# Patient Record
Sex: Male | Born: 1985 | Race: White | Hispanic: No | Smoking: Current every day smoker
Health system: Southern US, Community
[De-identification: ages and names within clinical notes are randomized; demographics above are authoritative.]

## PROBLEM LIST (undated history)

## (undated) HISTORY — PX: CHOLECYSTECTOMY: SHX55

---

## 2006-11-14 ENCOUNTER — Ambulatory Visit (HOSPITAL_COMMUNITY): Admission: RE | Admit: 2006-11-14 | Discharge: 2006-11-14 | Payer: Self-pay | Admitting: Specialist

## 2008-03-06 IMAGING — CR DG ORBITS FOR FOREIGN BODY
2 series · 2 of 2 positions shown · non-contrast
Comparison: None.

CLINICAL DATA: Pre-MRI/metal worker.
 ORBITS FOR FOREIGN BODY - 2 VIEW:

[view not recorded (1 of 2)]
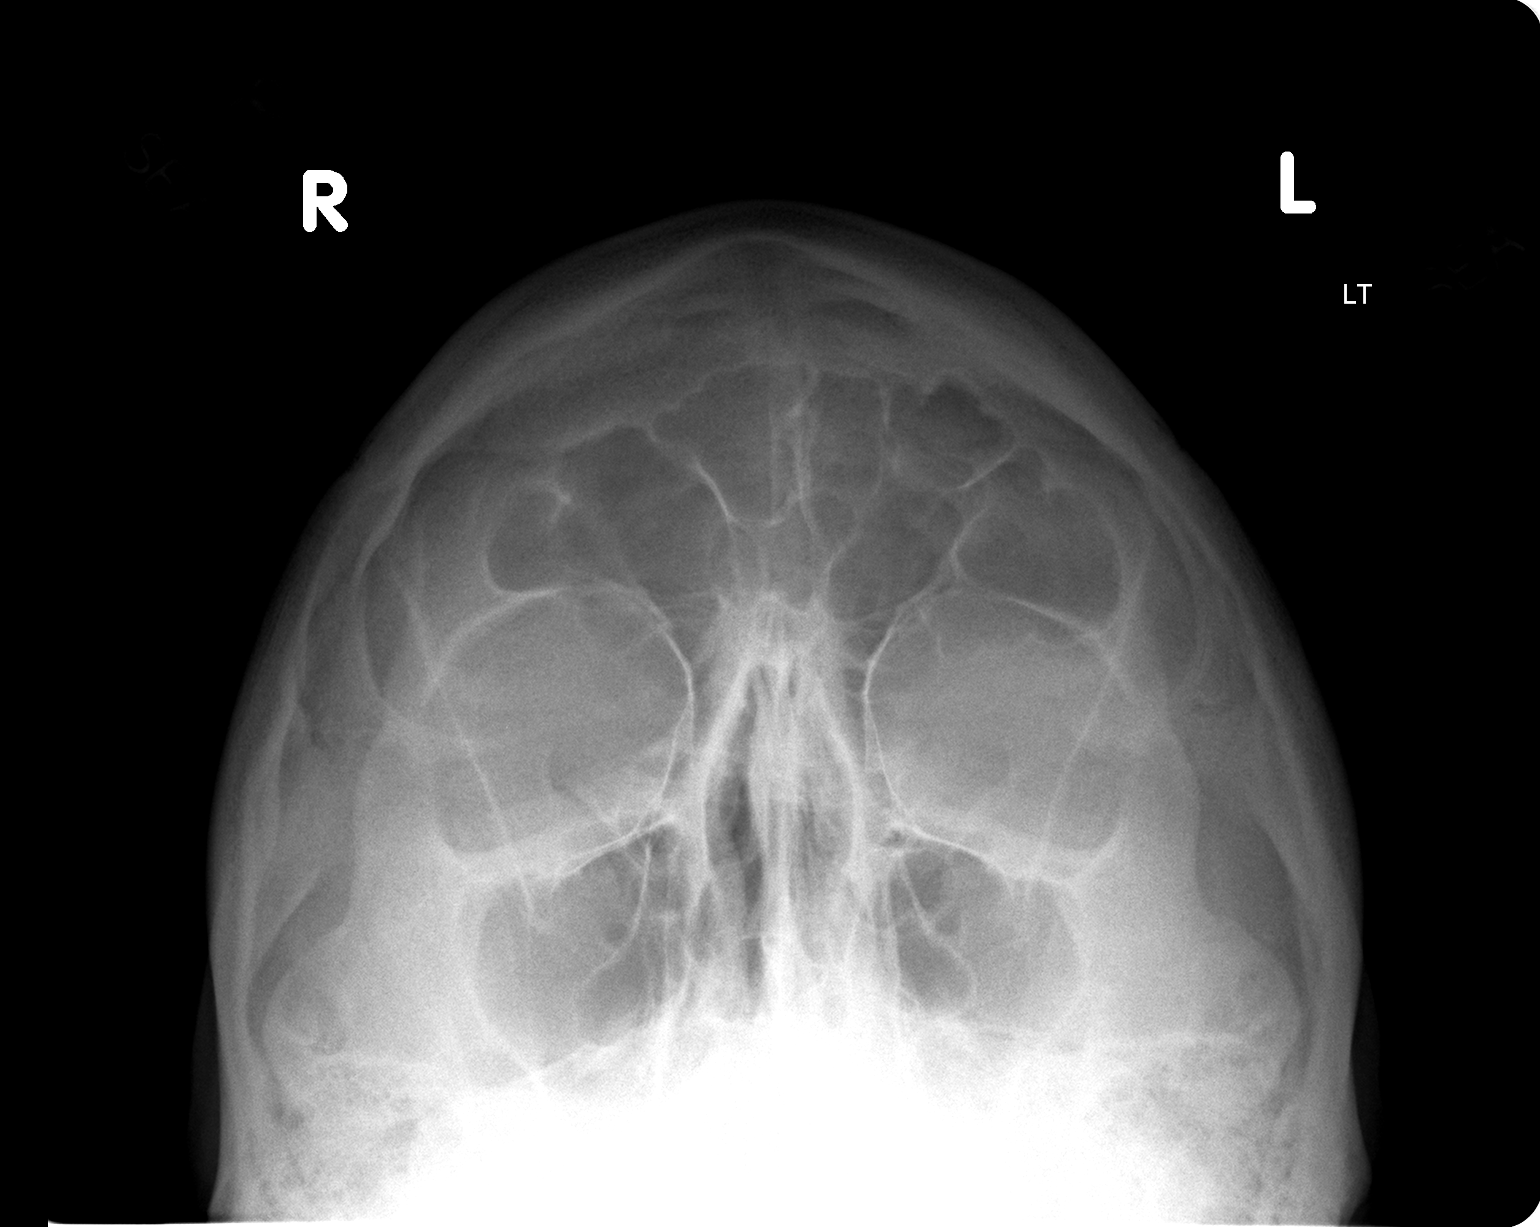

[view not recorded (2 of 2)]
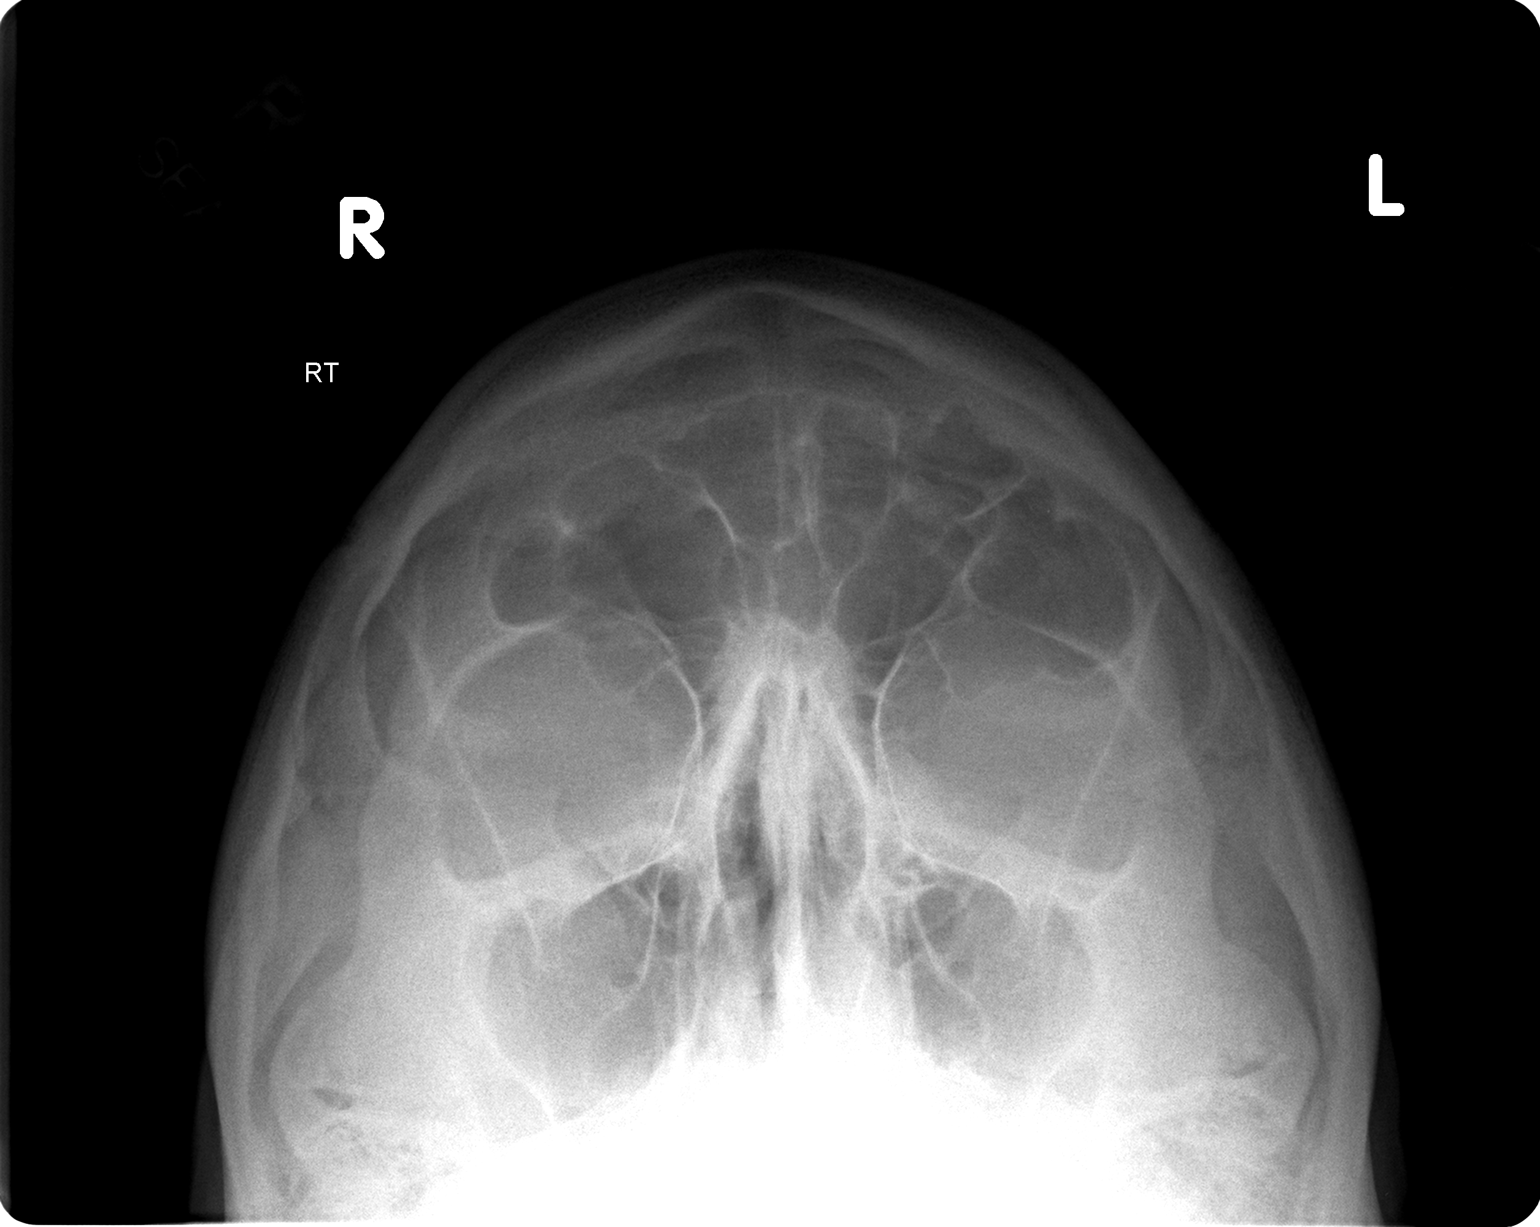

[2 of 2 positions shown; findings below may reference images not displayed]

FINDINGS: There is no evidence of metallic foreign body within the orbits.  No significant bone abnormality identified.
IMPRESSION: No evidence of metallic foreign body within the orbits.

## 2011-04-03 ENCOUNTER — Emergency Department (HOSPITAL_BASED_OUTPATIENT_CLINIC_OR_DEPARTMENT_OTHER)
Admission: EM | Admit: 2011-04-03 | Discharge: 2011-04-03 | Disposition: A | Discharge status: Home / Self Care | Payer: No Typology Code available for payment source | Attending: Emergency Medicine | Admitting: Emergency Medicine

## 2011-04-03 ENCOUNTER — Encounter (HOSPITAL_BASED_OUTPATIENT_CLINIC_OR_DEPARTMENT_OTHER): Payer: Self-pay

## 2011-04-03 DIAGNOSIS — M549 Dorsalgia, unspecified: Secondary | ICD-10-CM

## 2011-04-03 DIAGNOSIS — F17200 Nicotine dependence, unspecified, uncomplicated: Secondary | ICD-10-CM | POA: Insufficient documentation

## 2011-04-03 DIAGNOSIS — M546 Pain in thoracic spine: Secondary | ICD-10-CM | POA: Insufficient documentation

## 2011-04-03 DIAGNOSIS — M545 Low back pain, unspecified: Secondary | ICD-10-CM | POA: Insufficient documentation

## 2011-04-03 DIAGNOSIS — Y9289 Other specified places as the place of occurrence of the external cause: Secondary | ICD-10-CM | POA: Insufficient documentation

## 2011-04-03 MED ORDER — IBUPROFEN 800 MG PO TABS
800.0000 mg | ORAL_TABLET | Freq: Once | ORAL | Status: AC
  Administered 2011-04-03: 800 mg via ORAL
  Filled 2011-04-03: qty 1

## 2011-04-03 MED ORDER — IBUPROFEN 800 MG PO TABS: 800.0000 mg | ORAL_TABLET | Freq: Three times a day (TID) | ORAL | Status: AC
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
## 2011-04-03 NOTE — ED Notes (Signed)
Karen Sofia, EDPA at bedside. 

## 2011-04-03 NOTE — ED Notes (Signed)
rx x 1 given for ibuprofen 

## 2011-04-05 NOTE — ED Provider Notes (Signed)
Medical screening examination/treatment/procedure(s) were performed by non-physician practitioner and as supervising physician I was immediately available for consultation/collaboration.   Gwyneth Sprout, MD 04/05/11 1115

## 2016-12-20 ENCOUNTER — Emergency Department (HOSPITAL_BASED_OUTPATIENT_CLINIC_OR_DEPARTMENT_OTHER)
Admission: EM | Admit: 2016-12-20 | Discharge: 2016-12-20 | Disposition: A | Discharge status: Home / Self Care | Payer: Self-pay | Attending: Physician Assistant | Admitting: Physician Assistant

## 2016-12-20 ENCOUNTER — Other Ambulatory Visit: Payer: Self-pay

## 2016-12-20 ENCOUNTER — Encounter (HOSPITAL_BASED_OUTPATIENT_CLINIC_OR_DEPARTMENT_OTHER): Payer: Self-pay | Admitting: *Deleted

## 2016-12-20 DIAGNOSIS — F17210 Nicotine dependence, cigarettes, uncomplicated: Secondary | ICD-10-CM | POA: Insufficient documentation

## 2016-12-20 DIAGNOSIS — J069 Acute upper respiratory infection, unspecified: Secondary | ICD-10-CM | POA: Insufficient documentation

## 2016-12-20 MED ORDER — IBUPROFEN 800 MG PO TABS
800.0000 mg | ORAL_TABLET | Freq: Once | ORAL | Status: AC
  Administered 2016-12-20: 800 mg via ORAL
  Filled 2016-12-20: qty 1

## 2016-12-20 MED ORDER — GUAIFENESIN ER 600 MG PO TB12: 600.0000 mg | ORAL_TABLET | Freq: Two times a day (BID) | ORAL | 0 refills | Status: AC

## 2016-12-20 NOTE — ED Triage Notes (Signed)
Pt c/o URi symptoms x 2 days 

## 2016-12-20 NOTE — ED Notes (Signed)
ED Provider at bedside. 

## 2016-12-20 NOTE — ED Provider Notes (Signed)
[facility] EMERGENCY DEPARTMENT Provider Note   CSN: [id number] Arrival date & time: 12/20/16  1304     History   Chief Complaint Chief Complaint  Patient presents with  . URI    HPI Roy Sims is a 31 y.o. male.  HPI   31 year old male presenting with upper respiratory symptoms.  Patient has likely virus.  Sinus congestion, mild sore throat, occasional cough.  Normal vital signs eating and drinking normally.  No fevers.  History reviewed. No pertinent past medical history.  There are no active problems to display for this patient.   Past Surgical History:  Procedure Laterality Date  . CHOLECYSTECTOMY         Home Medications    Prior to Admission medications   Medication Sig Start Date End Date Taking? Authorizing Provider  guaiFENesin (MUCINEX) 600 MG 12 hr tablet Take 1 tablet (600 mg total) by mouth 2 (two) times daily. 12/20/16   Krisna Omar, Cindee Saltourteney Lyn, MD    Family History History reviewed. No pertinent family history.  Social History Social History   Tobacco Use  . Smoking status: Current Every Day Smoker    Packs/day: 0.50    Years: 8.00    Pack years: 4.00    Types: Cigarettes  . Smokeless tobacco: Current User    Types: Snuff  Substance Use Topics  . Alcohol use: No  . Drug use: No     Allergies   Patient has no known allergies.   Review of Systems Review of Systems  Constitutional: Positive for fatigue. Negative for activity change and fever.  HENT: Positive for congestion, sinus pressure, sinus pain and sore throat.   Respiratory: Negative for shortness of breath.   Cardiovascular: Negative for chest pain.  Gastrointestinal: Negative for abdominal pain.     Physical Exam Updated Vital Signs BP (!) 142/86   Pulse 89   Temp 98.2 F (36.8 C) (Oral)   Resp 16   Ht 6\' 5"  (1.956 m)   Wt 131.5 kg (290 lb)   SpO2 98%   BMI 34.39 kg/m   Physical Exam  Constitutional: He is oriented to person, place, and  time. He appears well-nourished.  HENT:  Head: Normocephalic.  Right Ear: External ear normal.  Left Ear: External ear normal.  Mouth/Throat: Oropharynx is clear and moist.  Bilateral TM normal.   Eyes: Conjunctivae and EOM are normal. Pupils are equal, round, and reactive to light.  Neck: Normal range of motion. Neck supple.  Cardiovascular: Normal rate and regular rhythm.  Pulmonary/Chest: Effort normal and breath sounds normal. No stridor. No respiratory distress. He has no wheezes. He has no rales.  Neurological: He is oriented to person, place, and time.  Skin: Skin is warm and dry. He is not diaphoretic.  Psychiatric: He has a normal mood and affect. His behavior is normal.     ED Treatments / Results  Labs (all labs ordered are listed, but only abnormal results are displayed) Labs Reviewed - No data to display  EKG  EKG Interpretation None       Radiology No results found.  Procedures Procedures (including critical care time)  Medications Ordered in ED Medications  ibuprofen (ADVIL,MOTRIN) tablet 800 mg (800 mg Oral Given 12/20/16 1326)     Initial Impression / Assessment and Plan / ED Course  I have reviewed the triage vital signs and the nursing notes.  Pertinent labs & imaging results that were available during my care of the patient were reviewed  by me and considered in my medical decision making (see chart for details).     31 year old male presenting with upper respiratory symptoms.  Patient has likely virus.  Sinus congestion, mild sore throat, occasional cough.  Normal vital signs eating and drinking normally.  No fevers.  Suspect virus. Well appearing, will treat with symptomatic care.   Final Clinical Impressions(s) / ED Diagnoses   Final diagnoses:  Upper respiratory tract infection, unspecified type    ED Discharge Orders        Ordered    guaiFENesin (MUCINEX) 600 MG 12 hr tablet  2 times daily     12/20/16 1324       Arris Meyn,  Stclair Szymborski Lyn, MD 12/20/16 1447

## 2016-12-20 NOTE — Discharge Instructions (Addendum)
Please rest drink plenty of fluids.  You can take over-the-counter counter things such as decongestants, ibuprofen or Tylenol to help with symptoms.
